# Patient Record
Sex: Female | Born: 1960 | Hispanic: No | Marital: Married | Smoking: Never smoker
Health system: Southern US, Community
[De-identification: ages and names within clinical notes are randomized; demographics above are authoritative.]

## PROBLEM LIST (undated history)

## (undated) HISTORY — PX: FOOT SURGERY: SHX648

---

## 1998-04-29 ENCOUNTER — Other Ambulatory Visit: Admission: RE | Admit: 1998-04-29 | Discharge: 1998-04-29 | Payer: Self-pay | Admitting: *Deleted

## 1999-05-15 ENCOUNTER — Other Ambulatory Visit: Admission: RE | Admit: 1999-05-15 | Discharge: 1999-05-15 | Payer: Self-pay | Admitting: *Deleted

## 2000-05-20 ENCOUNTER — Other Ambulatory Visit: Admission: RE | Admit: 2000-05-20 | Discharge: 2000-05-20 | Payer: Self-pay | Admitting: *Deleted

## 2001-05-21 ENCOUNTER — Other Ambulatory Visit: Admission: RE | Admit: 2001-05-21 | Discharge: 2001-05-21 | Payer: Self-pay | Admitting: *Deleted

## 2001-10-20 ENCOUNTER — Other Ambulatory Visit: Admission: RE | Admit: 2001-10-20 | Discharge: 2001-10-20 | Payer: Self-pay | Admitting: *Deleted

## 2002-01-22 HISTORY — PX: BREAST EXCISIONAL BIOPSY: SUR124

## 2002-01-27 ENCOUNTER — Encounter (INDEPENDENT_AMBULATORY_CARE_PROVIDER_SITE_OTHER): Payer: Self-pay | Admitting: *Deleted

## 2002-01-27 ENCOUNTER — Ambulatory Visit (HOSPITAL_BASED_OUTPATIENT_CLINIC_OR_DEPARTMENT_OTHER): Admission: RE | Admit: 2002-01-27 | Discharge: 2002-01-27 | Payer: Self-pay | Admitting: Surgery

## 2002-05-21 ENCOUNTER — Other Ambulatory Visit: Admission: RE | Admit: 2002-05-21 | Discharge: 2002-05-21 | Payer: Self-pay | Admitting: *Deleted

## 2003-04-12 ENCOUNTER — Other Ambulatory Visit: Admission: RE | Admit: 2003-04-12 | Discharge: 2003-04-12 | Payer: Self-pay | Admitting: *Deleted

## 2004-04-11 ENCOUNTER — Ambulatory Visit: Payer: Self-pay | Admitting: Internal Medicine

## 2004-04-26 ENCOUNTER — Other Ambulatory Visit: Admission: RE | Admit: 2004-04-26 | Discharge: 2004-04-26 | Payer: Self-pay | Admitting: *Deleted

## 2004-09-14 ENCOUNTER — Ambulatory Visit: Payer: Self-pay | Admitting: Internal Medicine

## 2004-09-19 ENCOUNTER — Ambulatory Visit: Payer: Self-pay | Admitting: Internal Medicine

## 2005-01-26 ENCOUNTER — Ambulatory Visit: Payer: Self-pay | Admitting: Internal Medicine

## 2005-05-14 ENCOUNTER — Other Ambulatory Visit: Admission: RE | Admit: 2005-05-14 | Discharge: 2005-05-14 | Payer: Self-pay | Admitting: *Deleted

## 2005-07-04 ENCOUNTER — Other Ambulatory Visit: Admission: RE | Admit: 2005-07-04 | Discharge: 2005-07-04 | Payer: Self-pay | Admitting: *Deleted

## 2005-09-20 ENCOUNTER — Ambulatory Visit: Payer: Self-pay | Admitting: Internal Medicine

## 2005-11-28 ENCOUNTER — Ambulatory Visit: Payer: Self-pay | Admitting: Internal Medicine

## 2006-01-09 ENCOUNTER — Other Ambulatory Visit: Admission: RE | Admit: 2006-01-09 | Discharge: 2006-01-09 | Payer: Self-pay | Admitting: *Deleted

## 2006-05-13 ENCOUNTER — Other Ambulatory Visit: Admission: RE | Admit: 2006-05-13 | Discharge: 2006-05-13 | Payer: Self-pay | Admitting: *Deleted

## 2006-05-15 ENCOUNTER — Ambulatory Visit: Payer: Self-pay | Admitting: Internal Medicine

## 2006-06-21 ENCOUNTER — Encounter: Admission: RE | Admit: 2006-06-21 | Discharge: 2006-06-21 | Payer: Self-pay | Admitting: General Surgery

## 2006-10-09 ENCOUNTER — Ambulatory Visit: Payer: Self-pay | Admitting: Internal Medicine

## 2006-10-10 ENCOUNTER — Ambulatory Visit: Payer: Self-pay | Admitting: Internal Medicine

## 2006-12-17 ENCOUNTER — Telehealth (INDEPENDENT_AMBULATORY_CARE_PROVIDER_SITE_OTHER): Payer: Self-pay | Admitting: *Deleted

## 2006-12-23 ENCOUNTER — Encounter: Admission: RE | Admit: 2006-12-23 | Discharge: 2006-12-23 | Payer: Self-pay | Admitting: General Surgery

## 2007-04-09 ENCOUNTER — Ambulatory Visit: Payer: Self-pay | Admitting: Internal Medicine

## 2007-05-13 ENCOUNTER — Other Ambulatory Visit: Admission: RE | Admit: 2007-05-13 | Discharge: 2007-05-13 | Payer: Self-pay | Admitting: Gynecology

## 2007-05-19 ENCOUNTER — Encounter: Admission: RE | Admit: 2007-05-19 | Discharge: 2007-05-19 | Payer: Self-pay | Admitting: *Deleted

## 2007-10-06 DIAGNOSIS — J301 Allergic rhinitis due to pollen: Secondary | ICD-10-CM | POA: Insufficient documentation

## 2007-10-07 ENCOUNTER — Ambulatory Visit: Payer: Self-pay | Admitting: Internal Medicine

## 2007-10-16 ENCOUNTER — Ambulatory Visit: Payer: Self-pay | Admitting: Internal Medicine

## 2008-03-16 ENCOUNTER — Ambulatory Visit: Payer: Self-pay | Admitting: Internal Medicine

## 2008-04-06 ENCOUNTER — Encounter: Admission: RE | Admit: 2008-04-06 | Discharge: 2008-04-06 | Payer: Self-pay | Admitting: Gynecology

## 2008-08-10 ENCOUNTER — Ambulatory Visit: Payer: Self-pay | Admitting: Internal Medicine

## 2008-10-08 ENCOUNTER — Ambulatory Visit: Payer: Self-pay | Admitting: Internal Medicine

## 2009-01-19 ENCOUNTER — Ambulatory Visit: Payer: Self-pay | Admitting: Internal Medicine

## 2009-03-11 ENCOUNTER — Emergency Department (HOSPITAL_COMMUNITY): Admission: EM | Admit: 2009-03-11 | Discharge: 2009-03-11 | Payer: Self-pay | Admitting: Emergency Medicine

## 2009-04-11 ENCOUNTER — Encounter: Admission: RE | Admit: 2009-04-11 | Discharge: 2009-04-11 | Payer: Self-pay | Admitting: Gynecology

## 2009-07-07 ENCOUNTER — Ambulatory Visit: Payer: Self-pay | Admitting: Internal Medicine

## 2009-10-07 ENCOUNTER — Ambulatory Visit: Payer: Self-pay | Admitting: Internal Medicine

## 2009-12-14 ENCOUNTER — Ambulatory Visit: Payer: Self-pay | Admitting: Internal Medicine

## 2010-02-12 ENCOUNTER — Encounter: Payer: Self-pay | Admitting: General Surgery

## 2010-02-21 NOTE — Assessment & Plan Note (Signed)
Summary: 1 YEAR//MBW   Primary Provider/Referring Provider:  Lajean Manes  CC:  Pt here for yearly follow up. Pt c/o PND this AM. No other complaints to report.  History of Present Illness: 10/10/06- Last visit:  HISTORY OF PRESENT ILLNESS:  She has done very well.  Her physician father continues to give her allergy injections with no problems.  She is convinced they help and that she is much better since she has been on them.  Asked to refill Clarinex; asked to change Nasonex to cheaper fluticasone.  We discussed these meds, and I invited her to try over-the- counter Claritin.  She still has no wheezing, nothing purulent.   10/07/07- returns for follow-up.  Change Claritin to Zyrtec- d for congestion.  Father continues to give her allergy vaccine at 1:10 with no problems.  Risk and epinephrine again, were reviewed.  10/08/08- Allergic rhinitis One year f/u. In last 2 weeks some seasonal rhinitis with some stuffiness and drainage treated with zyrtec-D and fluticasone.  Discussed risk, benefit and epipen. Father is retired physician who gives her shots and we discussed this as well. Never asthma.  October 07, 2009- Allergic rhinitis One year f/u. Says she did really well over the last year. Needed suplemental med only rarely. Today with temprature change she used nose spray. Allergy vaccine doing very well.  Got flu shot yesterday at work.   Preventive Screening-Counseling & Management  Alcohol-Tobacco     Smoking Status: never  Current Medications (verified): 1)  Allergy Vaccine 1:10 Father Gives .... Once Weekly 2)  Epipen 0.3 Mg/0.60ml (1:1000) Devi (Epinephrine Hcl (Anaphylaxis)) .... For Severe Allergic Reaction 3)  Zyrtec-D Allergy & Congestion 5-120 Mg Xr12h-Tab (Cetirizine-Pseudoephedrine) .... Take As Needed 4)  Fluticasone Propionate 50 Mcg/act Susp (Fluticasone Propionate) .Marland Kitchen.. 1-2 Sprays in Each Nostril Daily  Allergies (verified): 1)  ! Penicillin  Past  History:  Past Medical History: Last updated: 10/07/2007  ALLERGIC RHINITIS, SEASONAL (ICD-477.0)  Past Surgical History: Last updated: 10/08/2008 Breast lump right- benign Foot surgery  Family History: Last updated: 10/08/2008 Paternal grandfather diabetes  Social History: Last updated: 10/08/2008 Married with one child Works full time at [employer] Never smoked  Risk Factors: Smoking Status: never (10/07/2009)  Review of Systems      See HPI  The patient denies shortness of breath with activity, shortness of breath at rest, productive cough, non-productive cough, coughing up blood, chest pain, irregular heartbeats, acid heartburn, indigestion, loss of appetite, weight change, abdominal pain, difficulty swallowing, sore throat, tooth/dental problems, headaches, sneezing, itching, ear ache, rash, change in color of mucus, and fever.    Vital Signs:  Patient profile:   50 year old female Height:      62 inches Weight:      108 pounds BMI:     19.82 O2 Sat:      100 % on Room air Pulse rate:   87 / minute BP sitting:   120 / 80  (left arm) Cuff size:   regular  Vitals Entered By: Zackery Barefoot CMA (October 07, 2009 10:27 AM)  O2 Flow:  Room air CC: Pt here for yearly follow up. Pt c/o PND this AM. No other complaints to report Comments Medications reviewed with patient Verified contact number and pharmacy with patient Zackery Barefoot CMA  October 07, 2009 10:34 AM    Physical Exam  Additional Exam:  General: A/Ox3; pleasant and cooperative, NAD, SKIN: no rash, lesions NODES: no lymphadenopathy HEENT: [place]/AT, EOM- WNL, Conjuctivae- clear,  PERRLA, TM-WNL, Nose- clear, Throat- clear and wnl, Mallampati II NECK: Supple w/ fair ROM, JVD- none, normal carotid impulses w/o bruits Thyroid-  CHEST: Clear to P&A HEART: RRR, no m/g/r heard ABDOMEN: nontender ZOX:WRUE, nl pulses, no edema  NEURO: Grossly intact to observation      Impression &  Recommendations:  Problem # 1:  ALLERGIC RHINITIS, SEASONAL (ICD-477.0)  Doing very well with no reason to make changes.  We reviewed allergy vaccine safety and goals again.  Other Orders: Est. Patient Level III (45409)  Patient Instructions: 1)  Please schedule a follow-up appointment in 1 year. 2)  I think you are doing great. Please let me know if you have any questions or problems.  Prescriptions: FLUTICASONE PROPIONATE 50 MCG/ACT SUSP (FLUTICASONE PROPIONATE) 1-2 sprays in each nostril daily  #1 x prn   Entered and Authorized by:   Waymon Budge MD   Signed by:   Waymon Budge MD on 10/07/2009   Method used:   Electronically to        [facility]  [phone]* (retail)       [address] [place], [place]  [address]       Ph: [phone] or [phone]       Fax: [phone]   RxID:   [id number] EPIPEN 0.3 MG/0.3ML (1:1000) DEVI (EPINEPHRINE HCL (ANAPHYLAXIS)) For severe allergic reaction  #1 x prn   Entered and Authorized by:   Waymon Budge MD   Signed by:   Waymon Budge MD on 10/07/2009   Method used:   Electronically to        [facility]  [phone]* (retail)       [address] [place], [place]  [address]       Ph: [phone] or [phone]       Fax: [phone]   RxID:   [id number]     Immunization History:  Influenza Immunization History:    Influenza:  historical (10/06/2009)

## 2010-04-03 ENCOUNTER — Other Ambulatory Visit: Payer: Self-pay | Admitting: Gynecology

## 2010-04-03 DIAGNOSIS — Z1231 Encounter for screening mammogram for malignant neoplasm of breast: Secondary | ICD-10-CM

## 2010-04-18 ENCOUNTER — Ambulatory Visit: Payer: Self-pay

## 2010-05-02 ENCOUNTER — Ambulatory Visit
Admission: RE | Admit: 2010-05-02 | Discharge: 2010-05-02 | Disposition: A | Discharge status: Home / Self Care | Payer: BLUE CROSS/BLUE SHIELD | Source: Ambulatory Visit | Attending: Gynecology | Admitting: Gynecology

## 2010-05-02 DIAGNOSIS — Z1231 Encounter for screening mammogram for malignant neoplasm of breast: Secondary | ICD-10-CM

## 2010-05-09 ENCOUNTER — Ambulatory Visit (INDEPENDENT_AMBULATORY_CARE_PROVIDER_SITE_OTHER): Payer: BLUE CROSS/BLUE SHIELD

## 2010-05-09 DIAGNOSIS — J309 Allergic rhinitis, unspecified: Secondary | ICD-10-CM

## 2010-06-06 NOTE — Assessment & Plan Note (Signed)
[facility]                             PULMONARY OFFICE NOTE   NAME:JOYCEHalayna, Blane                      MRN:          [id number]  DATE:10/10/2006                            DOB:          [birth year]    PROBLEM LIST:  Seasonal allergic rhinitis.   HISTORY OF PRESENT ILLNESS:  She has done very well.  Her physician  father continues to give her allergy injections with no problems.  She  is convinced they help and that she is much better since she has been on  them.  Asked to refill Clarinex; asked to change Nasonex to cheaper  fluticasone.  We discussed these meds, and I invited her to try over-the-  counter Claritin.  She still has no wheezing, nothing purulent.   MEDICATIONS:  1. Allergy vaccine.  2. Clarinex.  3. Nasonex.  4. Drug intolerant penicillin with rash.   PHYSICAL EXAMINATION/OBJECTIVE:  VITAL SIGNS:  Weight 109 pounds, BP  134/80, pulse 93, room air saturation 100%.  HEENT:  Nasal mucosa looks normal, conjunctivae are not injected.  There  is no periorbital edema.  No postnasal drainage or hoarseness.  Breathing unlabored.  Pulse regular without murmur.   IMPRESSION:  Allergic rhinitis under good control.   PLAN:  Medications discussed.  Nasonex changed to fluticasone nasal  spray once or twice each nostril daily.  She will continue her allergy  vaccine and schedule return in one year or earlier p.r.n.     Clinton D. Maple Hudson, MD, Tonny Bollman, FACP  Electronically Signed    CDY/MedQ  DD: 10/10/2006  DT: 10/11/2006  Job #: [id number]   cc:   Oley Balm. Georgina Pillion, M.D.

## 2010-06-09 NOTE — Assessment & Plan Note (Signed)
[facility]                               PULMONARY OFFICE NOTE   NAME:[name], [name]                      MRN:          [id number]  DATE:09/20/2005                            DOB:          1960/02/02    PROBLEM:  Seasonal allergic rhinitis, history.  One year follow-up.   She reports this is a good year.  She never has asthma and in spite of the  air quality, she has been symptomatically pretty comfortable.  She continues  allergy vaccine at 1:10 with injections given by her father, a retired  Development worker, community.  We took time today to go over risk management and options  related to allergy vaccine.  We discussed policy related to administration  away from a medical office, anaphylaxis, epinephrine and choices.  She  reviewed our waver form and wished to continue getting her vaccine at home.  She has had no reactions or adverse problems and feels it helps.   MEDICATIONS:  1. Allergy vaccine.  2. Clarinex.  3. Nasonex.   ALLERGIES:  Drug intolerant to PENICILLIN with rash.   OBJECTIVE:  VITAL SIGNS:  Weight 112 pounds, blood pressure 128/68, pulse  regular 78, room air saturation 100%.  SKIN:  Clear.  No adenopathy found.  HEENT:  Conjunctivae were clear with normal secretions.  Nasal mucosa did  not look inflamed or edematous.  Her pharynx was clear with no post nasal  drip.  LUNGS:  Clear to percussion and auscultation.  CARDIOVASCULAR:  Heart sounds were regular without  murmur.   IMPRESSION:  Stable allergic rhinitis comfortably controlled.   PLAN:  1. Continue vaccine.  2. Refill Nasonex and Clarinex.  3. Refill Epipen with discussion.  4. Schedule return one year, earlier p.r.n.                                  Clinton D. Maple Hudson, MD, FCCP, FACP   CDY/MedQ  DD:  09/20/2005  DT:  09/21/2005  Job #:  [id number]   cc:   Oley Balm. Georgina Pillion, MD

## 2010-06-09 NOTE — Op Note (Signed)
[name], [name]                         ACCOUNT NO.:  [id number]   MEDICAL RECORD NO.:  [id number]                   PATIENT TYPE:  AMB   LOCATION:  [facility]                                  FACILITY:  [facility]   PHYSICIAN:  Abigail Miyamoto, M.D.              DATE OF BIRTH:  June 30, 1960   DATE OF PROCEDURE:  01/27/2002  DATE OF DISCHARGE:                                 OPERATIVE REPORT   PREOPERATIVE DIAGNOSES:  Right breast mass.   POSTOPERATIVE DIAGNOSES:  Right breast mass.   PROCEDURE:  Excision of right breast mass.   SURGEON:  Abigail Miyamoto, M.D.   ANESTHESIA:  1% lidocaine and monitored anesthesia care.   ESTIMATED BLOOD LOSS:  Minimal.   INDICATIONS FOR PROCEDURE:  [name] is a 50 year old female who felt  a palpable fullness in the right upper quadrant of the right breast.  Mammogram was unremarkable except for fibrocystic changes and indeed  ultrasound was similar except for some mild change in echogenicity in this  area. On examination, she did have approximately a 1.5 cm to 2 cm palpable  area in the tail of Spence. We had a discussion regarding this and she  elected to have this area excised instead of following it conservatively  with follow-up films.   DESCRIPTION OF PROCEDURE:  The patient was brought to the operating room,  identified as [name]. She was placed supine on the operating room  table and then anesthesia was induced. The right breast was then prepped and  draped in the usual sterile fashion. The skin overlying the palpable mass in  the right upper outer quadrant was then anesthetized with 1% lidocaine. A  small transverse incision was made over the palpable abnormality with the  scalpel. Electrocautery was then used to fully dissect out the palpable  mass. The mass appeared to be consistent with fibrocystic breast tissue.  Allis clamps were used to elevate the mass and the mass was taken down in  its entirety with the  electrocautery circumferentially. The mass was sent to  pathology for identification. The cavity was examined and hemostasis was  achieved with the cautery. The cavity was then irrigated with normal saline.  The subcutaneous layer was then closed with interrupted 3-0 Vicryl sutures,  the skin was closed with running 4-0 Monocryl. Steri-Strips, gauze and tape  were then applied. The patient tolerated the procedure well. All sponge,  needle and instrument counts were correct at the end of the procedure. The  patient was then taken in a stable condition from the operating room to the  recovery room.                                               Abigail Miyamoto, M.D.    DB/MEDQ  D:  01/27/2002  T:  01/27/2002  Job:  [id number]

## 2010-06-11 ENCOUNTER — Other Ambulatory Visit: Payer: Self-pay | Admitting: Internal Medicine

## 2010-06-15 ENCOUNTER — Telehealth: Payer: Self-pay | Admitting: Internal Medicine

## 2010-06-15 MED ORDER — FLUTICASONE PROPIONATE 50 MCG/ACT NA SUSP: NASAL | Status: DC

## 2010-06-15 NOTE — Telephone Encounter (Signed)
Rx was refilled.  Spoke with pt and notified this was done.

## 2010-10-06 ENCOUNTER — Ambulatory Visit: Payer: Self-pay | Admitting: Internal Medicine

## 2010-10-25 ENCOUNTER — Ambulatory Visit: Payer: Self-pay | Admitting: Internal Medicine

## 2010-11-01 ENCOUNTER — Ambulatory Visit (INDEPENDENT_AMBULATORY_CARE_PROVIDER_SITE_OTHER): Payer: BLUE CROSS/BLUE SHIELD

## 2010-11-01 DIAGNOSIS — J309 Allergic rhinitis, unspecified: Secondary | ICD-10-CM

## 2010-11-27 ENCOUNTER — Encounter: Payer: Self-pay | Admitting: Internal Medicine

## 2010-11-28 ENCOUNTER — Encounter: Payer: Self-pay | Admitting: Internal Medicine

## 2010-11-28 ENCOUNTER — Ambulatory Visit (INDEPENDENT_AMBULATORY_CARE_PROVIDER_SITE_OTHER): Payer: BLUE CROSS/BLUE SHIELD | Admitting: Internal Medicine

## 2010-11-28 VITALS — BP 126/82 | HR 76 | Ht 62.0 in | Wt 111.0 lb

## 2010-11-28 DIAGNOSIS — J301 Allergic rhinitis due to pollen: Secondary | ICD-10-CM

## 2010-11-28 MED ORDER — EPINEPHRINE 0.3 MG/0.3ML IJ DEVI: 0.3000 mg | Freq: Once | INTRAMUSCULAR | Status: DC

## 2010-11-28 MED ORDER — FLUTICASONE PROPIONATE 50 MCG/ACT NA SUSP: NASAL | Status: DC

## 2010-11-28 NOTE — Patient Instructions (Signed)
Continue present treatment- please call as needed  Scripts sent for Epipen and for fluticasone nasal spray
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
## 2010-11-30 NOTE — Assessment & Plan Note (Signed)
She is satisfied to continue her allergy vaccine and doesn't want changes made. We discussed risks benefits and goals.

## 2011-03-29 ENCOUNTER — Other Ambulatory Visit: Payer: Self-pay | Admitting: Gynecology

## 2011-03-29 DIAGNOSIS — Z1231 Encounter for screening mammogram for malignant neoplasm of breast: Secondary | ICD-10-CM

## 2011-05-03 ENCOUNTER — Ambulatory Visit: Payer: BLUE CROSS/BLUE SHIELD

## 2011-05-08 ENCOUNTER — Ambulatory Visit: Payer: BLUE CROSS/BLUE SHIELD

## 2011-05-16 ENCOUNTER — Ambulatory Visit (INDEPENDENT_AMBULATORY_CARE_PROVIDER_SITE_OTHER): Payer: BLUE CROSS/BLUE SHIELD

## 2011-05-16 DIAGNOSIS — J309 Allergic rhinitis, unspecified: Secondary | ICD-10-CM

## 2011-05-21 ENCOUNTER — Ambulatory Visit: Payer: BLUE CROSS/BLUE SHIELD

## 2011-05-22 ENCOUNTER — Ambulatory Visit
Admission: RE | Admit: 2011-05-22 | Discharge: 2011-05-22 | Disposition: A | Discharge status: Home / Self Care | Payer: BC Managed Care – PPO | Source: Ambulatory Visit | Attending: Gynecology | Admitting: Gynecology

## 2011-05-22 DIAGNOSIS — Z1231 Encounter for screening mammogram for malignant neoplasm of breast: Secondary | ICD-10-CM

## 2011-11-28 ENCOUNTER — Ambulatory Visit: Payer: BLUE CROSS/BLUE SHIELD | Admitting: Internal Medicine

## 2011-11-29 ENCOUNTER — Encounter: Payer: Self-pay | Admitting: Internal Medicine

## 2011-11-29 ENCOUNTER — Ambulatory Visit (INDEPENDENT_AMBULATORY_CARE_PROVIDER_SITE_OTHER): Payer: BC Managed Care – PPO | Admitting: Internal Medicine

## 2011-11-29 VITALS — BP 118/76 | HR 87 | Ht 62.0 in | Wt 111.8 lb

## 2011-11-29 DIAGNOSIS — J301 Allergic rhinitis due to pollen: Secondary | ICD-10-CM

## 2011-11-29 MED ORDER — FLUTICASONE PROPIONATE 50 MCG/ACT NA SUSP: NASAL | Status: DC

## 2011-11-29 MED ORDER — EPINEPHRINE 0.3 MG/0.3ML IJ DEVI: 0.3000 mg | Freq: Once | INTRAMUSCULAR | Status: DC

## 2011-11-29 NOTE — Patient Instructions (Addendum)
Refill scripts sent for Epipen and for flonase/ fluticasone  We can continue your allergy vaccine  Please call as needed
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
## 2011-12-04 NOTE — Assessment & Plan Note (Signed)
Doing well with allergy vaccine and intervals using Flonase. She feels these are sufficient.  Plan- flonase and Epipen refilled with discussion.

## 2011-12-05 ENCOUNTER — Ambulatory Visit (INDEPENDENT_AMBULATORY_CARE_PROVIDER_SITE_OTHER): Payer: BC Managed Care – PPO

## 2011-12-05 DIAGNOSIS — J309 Allergic rhinitis, unspecified: Secondary | ICD-10-CM

## 2011-12-06 ENCOUNTER — Ambulatory Visit (INDEPENDENT_AMBULATORY_CARE_PROVIDER_SITE_OTHER): Payer: BC Managed Care – PPO

## 2011-12-06 DIAGNOSIS — J309 Allergic rhinitis, unspecified: Secondary | ICD-10-CM

## 2012-04-22 ENCOUNTER — Other Ambulatory Visit: Payer: Self-pay

## 2012-04-22 DIAGNOSIS — Z1231 Encounter for screening mammogram for malignant neoplasm of breast: Secondary | ICD-10-CM

## 2012-05-21 ENCOUNTER — Ambulatory Visit (INDEPENDENT_AMBULATORY_CARE_PROVIDER_SITE_OTHER): Payer: BC Managed Care – PPO

## 2012-05-21 DIAGNOSIS — J309 Allergic rhinitis, unspecified: Secondary | ICD-10-CM

## 2012-05-22 ENCOUNTER — Ambulatory Visit: Payer: BC Managed Care – PPO

## 2012-06-19 ENCOUNTER — Ambulatory Visit
Admission: RE | Admit: 2012-06-19 | Discharge: 2012-06-19 | Disposition: A | Discharge status: Home / Self Care | Payer: BC Managed Care – PPO | Source: Ambulatory Visit

## 2012-06-19 DIAGNOSIS — Z1231 Encounter for screening mammogram for malignant neoplasm of breast: Secondary | ICD-10-CM

## 2012-08-27 ENCOUNTER — Ambulatory Visit (INDEPENDENT_AMBULATORY_CARE_PROVIDER_SITE_OTHER): Payer: BC Managed Care – PPO

## 2012-08-27 DIAGNOSIS — J309 Allergic rhinitis, unspecified: Secondary | ICD-10-CM

## 2012-11-28 ENCOUNTER — Ambulatory Visit (INDEPENDENT_AMBULATORY_CARE_PROVIDER_SITE_OTHER): Payer: BC Managed Care – PPO | Admitting: Internal Medicine

## 2012-11-28 ENCOUNTER — Encounter: Payer: Self-pay | Admitting: Internal Medicine

## 2012-11-28 VITALS — BP 124/66 | HR 89 | Ht 62.0 in | Wt 110.4 lb

## 2012-11-28 DIAGNOSIS — J301 Allergic rhinitis due to pollen: Secondary | ICD-10-CM

## 2012-11-28 MED ORDER — EPINEPHRINE 0.3 MG/0.3ML IJ SOAJ: INTRAMUSCULAR | Status: DC

## 2012-11-28 NOTE — Patient Instructions (Signed)
We can continue allergy vaccine 1:10 GO  Epipen refill sent  Please call as needed
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
## 2012-12-10 ENCOUNTER — Other Ambulatory Visit: Payer: Self-pay | Admitting: Internal Medicine

## 2012-12-13 NOTE — Assessment & Plan Note (Signed)
Okay to continue allergy vaccine. We discussed risk/benefit and will refill EpiPen

## 2013-02-13 ENCOUNTER — Ambulatory Visit (INDEPENDENT_AMBULATORY_CARE_PROVIDER_SITE_OTHER): Payer: BC Managed Care – PPO

## 2013-02-13 DIAGNOSIS — J309 Allergic rhinitis, unspecified: Secondary | ICD-10-CM

## 2013-05-15 ENCOUNTER — Other Ambulatory Visit: Payer: Self-pay

## 2013-05-15 DIAGNOSIS — Z1231 Encounter for screening mammogram for malignant neoplasm of breast: Secondary | ICD-10-CM

## 2013-06-22 ENCOUNTER — Ambulatory Visit
Admission: RE | Admit: 2013-06-22 | Discharge: 2013-06-22 | Disposition: A | Discharge status: Home / Self Care | Payer: BC Managed Care – PPO | Source: Ambulatory Visit

## 2013-06-22 ENCOUNTER — Encounter (INDEPENDENT_AMBULATORY_CARE_PROVIDER_SITE_OTHER): Payer: Self-pay

## 2013-06-22 DIAGNOSIS — Z1231 Encounter for screening mammogram for malignant neoplasm of breast: Secondary | ICD-10-CM

## 2013-06-23 ENCOUNTER — Ambulatory Visit (INDEPENDENT_AMBULATORY_CARE_PROVIDER_SITE_OTHER): Payer: BC Managed Care – PPO

## 2013-06-23 DIAGNOSIS — J309 Allergic rhinitis, unspecified: Secondary | ICD-10-CM

## 2013-10-16 ENCOUNTER — Ambulatory Visit (INDEPENDENT_AMBULATORY_CARE_PROVIDER_SITE_OTHER): Payer: BC Managed Care – PPO

## 2013-10-16 DIAGNOSIS — J309 Allergic rhinitis, unspecified: Secondary | ICD-10-CM

## 2013-11-27 ENCOUNTER — Encounter: Payer: Self-pay | Admitting: Internal Medicine

## 2013-12-04 ENCOUNTER — Ambulatory Visit: Payer: BC Managed Care – PPO | Admitting: Internal Medicine

## 2013-12-14 ENCOUNTER — Ambulatory Visit (INDEPENDENT_AMBULATORY_CARE_PROVIDER_SITE_OTHER): Payer: BC Managed Care – PPO

## 2013-12-14 ENCOUNTER — Ambulatory Visit (INDEPENDENT_AMBULATORY_CARE_PROVIDER_SITE_OTHER): Payer: BC Managed Care – PPO | Admitting: Podiatry

## 2013-12-14 ENCOUNTER — Encounter: Payer: Self-pay | Admitting: Podiatry

## 2013-12-14 VITALS — BP 118/80 | HR 80 | Resp 16

## 2013-12-14 DIAGNOSIS — S99921S Unspecified injury of right foot, sequela: Secondary | ICD-10-CM

## 2013-12-14 DIAGNOSIS — M79674 Pain in right toe(s): Secondary | ICD-10-CM

## 2013-12-14 DIAGNOSIS — S8991XS Unspecified injury of right lower leg, sequela: Secondary | ICD-10-CM

## 2013-12-14 DIAGNOSIS — M2041 Other hammer toe(s) (acquired), right foot: Secondary | ICD-10-CM

## 2013-12-14 NOTE — Progress Notes (Signed)
Patient ID: [name], female   DOB: 28-Oct-1960, 53 y.o.   MRN: [id number]  Subjective: 53 year old phenol percent the office today with complaints of right foot intermittent pain. She states that her right second digit crosses over her big toe and she has pain particularly in shoe gear. She states of this been progressive over the past year. She denies any recent injury or trauma to the area. She previously has had [facility] approximately [facility] No other complaints at this time.  Review of systems: Negative otherwise stated above.  Objective: AAO x3, NAD DP/PT pulses palpable bilaterally, CRT less than 3 seconds Protective sensation intact with Simms Weinstein monofilament, vibratory sensation intact, Achilles tendon reflex intact There is a mild prominence of the medial aspect of the first metatarsal head with a slight lateral deviation of the hallux. There is second digit dislocation mobile MPJ crossing over the hallux which is reducible. There is medial deviation of the lesser digits. There is currently no pain with palpation or vibratory sensation over on this digits or the metatarsals. No pain with MTPJ range of motion. No overlying edema, erythema, increase in warmth. No open lesions or pre-ulcerative lesions. No calf pain, swelling, warmth, erythema.  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  Plan: -X-rays were obtained and reviewed with the patient. -Conservative versus surgical options were discussed including alternatives, risks, competitions. -At this time the patient elects to proceed with conservative treatment. Various toe splints/pads were dispensed the patient to help hold the toe in a rectus position. Discussed with her shaking with Ace while walking she is very active. Monitoring clinical changes. Follow-up as needed. Call the office with any questions, concerns, changes  symptoms.

## 2013-12-28 ENCOUNTER — Ambulatory Visit: Payer: BC Managed Care – PPO | Admitting: Internal Medicine

## 2014-01-21 ENCOUNTER — Encounter: Payer: Self-pay | Admitting: Internal Medicine

## 2014-01-21 ENCOUNTER — Ambulatory Visit (INDEPENDENT_AMBULATORY_CARE_PROVIDER_SITE_OTHER): Payer: BC Managed Care – PPO | Admitting: Internal Medicine

## 2014-01-21 VITALS — BP 118/70 | HR 80 | Ht 62.0 in | Wt 107.0 lb

## 2014-01-21 DIAGNOSIS — J301 Allergic rhinitis due to pollen: Secondary | ICD-10-CM

## 2014-01-21 MED ORDER — FLUTICASONE PROPIONATE 50 MCG/ACT NA SUSP: 1.0000 | Freq: Every day | NASAL | Status: AC

## 2014-01-21 MED ORDER — EPINEPHRINE 0.3 MG/0.3ML IJ SOAJ: INTRAMUSCULAR | Status: DC
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
## 2014-01-22 NOTE — Assessment & Plan Note (Signed)
She does very well with allergy vaccine and has had no problems. She wants to continue. She has less need for Flonase or antihistamines but we discussed them as supplements at options.

## 2014-01-22 NOTE — Patient Instructions (Signed)
We can continue allergy vaccine 1:10 G0 Refill prescriptions for EpiPen to hold for vaccine safety, and for Flonase as discussed

## 2014-02-26 ENCOUNTER — Ambulatory Visit (INDEPENDENT_AMBULATORY_CARE_PROVIDER_SITE_OTHER): Payer: Self-pay

## 2014-02-26 DIAGNOSIS — J309 Allergic rhinitis, unspecified: Secondary | ICD-10-CM

## 2014-04-30 ENCOUNTER — Other Ambulatory Visit: Payer: Self-pay

## 2014-04-30 DIAGNOSIS — Z1231 Encounter for screening mammogram for malignant neoplasm of breast: Secondary | ICD-10-CM

## 2014-06-24 ENCOUNTER — Ambulatory Visit
Admission: RE | Admit: 2014-06-24 | Discharge: 2014-06-24 | Disposition: A | Discharge status: Home / Self Care | Payer: BLUE CROSS/BLUE SHIELD | Source: Ambulatory Visit

## 2014-06-24 DIAGNOSIS — Z1231 Encounter for screening mammogram for malignant neoplasm of breast: Secondary | ICD-10-CM

## 2014-09-15 ENCOUNTER — Encounter: Payer: Self-pay | Admitting: Internal Medicine

## 2014-09-23 ENCOUNTER — Ambulatory Visit (INDEPENDENT_AMBULATORY_CARE_PROVIDER_SITE_OTHER): Payer: BLUE CROSS/BLUE SHIELD

## 2014-09-23 ENCOUNTER — Telehealth: Payer: Self-pay | Admitting: Internal Medicine

## 2014-09-23 DIAGNOSIS — J309 Allergic rhinitis, unspecified: Secondary | ICD-10-CM

## 2014-09-23 NOTE — Telephone Encounter (Signed)
Date Mixed: 09/23/14 Vial: 1 Strength: 1:10 Here/Mail/Pick Up: mail Mixed By: Darlyne Russian

## 2015-01-14 ENCOUNTER — Telehealth: Payer: Self-pay | Admitting: Internal Medicine

## 2015-01-14 NOTE — Telephone Encounter (Signed)
Patient has appointment scheduled for yearly check up on 01/25/15.  Patient would like to cancel the January appointment because she cannot get off work.  She wants to re-schedule in February, but no openings.  Also, patient wants to make sure she can still get her Allergy Serum if she cancels her January appointment.    Katie - please advise.

## 2015-01-14 NOTE — Telephone Encounter (Signed)
03/07/15 at 10:45am can be used for patient to be seen and as long as patient is current with appts and keeps her follow up then she should be fine with getting her allergy serum. Thanks.

## 2015-01-14 NOTE — Telephone Encounter (Signed)
Patient notified of below. Patient scheduled to see Dr. Maple HudsonYoung on 03/07/15 at 10:45am Patient aware of appointment. Nothing further needed.

## 2015-01-25 ENCOUNTER — Ambulatory Visit: Payer: BC Managed Care – PPO | Admitting: Internal Medicine

## 2015-03-07 ENCOUNTER — Ambulatory Visit: Payer: BLUE CROSS/BLUE SHIELD | Admitting: Internal Medicine

## 2015-04-25 ENCOUNTER — Telehealth: Payer: Self-pay | Admitting: Internal Medicine

## 2015-04-25 DIAGNOSIS — J309 Allergic rhinitis, unspecified: Secondary | ICD-10-CM | POA: Diagnosis not present

## 2015-04-25 NOTE — Telephone Encounter (Signed)
Allergy Serum Extract Date Mixed: 04/25/15 Vial: 1 Strength: 1:10 Here/Mail/Pick Up: pick-up Mixed By: tbs Last OV: 01/21/14 Pending OV: 05/23/15

## 2015-05-23 ENCOUNTER — Ambulatory Visit (INDEPENDENT_AMBULATORY_CARE_PROVIDER_SITE_OTHER): Payer: BLUE CROSS/BLUE SHIELD | Admitting: Internal Medicine

## 2015-05-23 ENCOUNTER — Encounter: Payer: Self-pay | Admitting: Internal Medicine

## 2015-05-23 VITALS — BP 114/66 | HR 84 | Ht 62.0 in | Wt 109.8 lb

## 2015-05-23 DIAGNOSIS — J301 Allergic rhinitis due to pollen: Secondary | ICD-10-CM | POA: Diagnosis not present

## 2015-05-23 MED ORDER — EPINEPHRINE 0.3 MG/0.3ML IJ SOAJ: INTRAMUSCULAR | Status: AC

## 2015-05-23 NOTE — Patient Instructions (Signed)
We can continue allergy shots through this year as discussed.  Please call as needed
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
## 2015-05-23 NOTE — Assessment & Plan Note (Signed)
No rhinitis symptoms are flaring so far this spring and we are now and grass pollen season. We discussed allergy vaccine and anticipation that the allergy clinic will close at this office in a year. She can stop at any time or continue allergy vaccine through this year. I would then suggest she be off of allergy shots, waiting to reestablish at another office if needed for treatment more than Flonase and antihistamine

## 2015-05-27 ENCOUNTER — Other Ambulatory Visit: Payer: Self-pay

## 2015-05-27 DIAGNOSIS — Z1231 Encounter for screening mammogram for malignant neoplasm of breast: Secondary | ICD-10-CM

## 2015-06-30 ENCOUNTER — Ambulatory Visit
Admission: RE | Admit: 2015-06-30 | Discharge: 2015-06-30 | Disposition: A | Discharge status: Home / Self Care | Payer: BLUE CROSS/BLUE SHIELD | Source: Ambulatory Visit

## 2015-06-30 ENCOUNTER — Other Ambulatory Visit: Payer: Self-pay | Admitting: Family Medicine

## 2015-06-30 DIAGNOSIS — Z1231 Encounter for screening mammogram for malignant neoplasm of breast: Secondary | ICD-10-CM

## 2015-07-31 ENCOUNTER — Emergency Department (HOSPITAL_COMMUNITY)
Admission: EM | Admit: 2015-07-31 | Discharge: 2015-07-31 | Disposition: A | Discharge status: Home / Self Care | Payer: BLUE CROSS/BLUE SHIELD | Attending: Emergency Medicine | Admitting: Emergency Medicine

## 2015-07-31 ENCOUNTER — Emergency Department (HOSPITAL_COMMUNITY): Payer: BLUE CROSS/BLUE SHIELD

## 2015-07-31 ENCOUNTER — Encounter (HOSPITAL_COMMUNITY): Payer: Self-pay

## 2015-07-31 DIAGNOSIS — R42 Dizziness and giddiness: Secondary | ICD-10-CM | POA: Diagnosis present

## 2015-07-31 LAB — CBC
HCT: 42.8 % (ref 36.0–46.0)
Hemoglobin: 14.2 g/dL (ref 12.0–15.0)
MCH: 30.7 pg (ref 26.0–34.0)
MCHC: 33.2 g/dL (ref 30.0–36.0)
MCV: 92.4 fL (ref 78.0–100.0)
PLATELETS: 268 10*3/uL (ref 150–400)
RBC: 4.63 MIL/uL (ref 3.87–5.11)
RDW: 13 % (ref 11.5–15.5)
WBC: 5.4 10*3/uL (ref 4.0–10.5)

## 2015-07-31 LAB — BASIC METABOLIC PANEL
Anion gap: 9 (ref 5–15)
BUN: 18 mg/dL (ref 6–20)
CALCIUM: 9.9 mg/dL (ref 8.9–10.3)
CO2: 25 mmol/L (ref 22–32)
CREATININE: 0.74 mg/dL (ref 0.44–1.00)
Chloride: 105 mmol/L (ref 101–111)
Glucose, Bld: 155 mg/dL — ABNORMAL HIGH (ref 65–99)
Potassium: 5 mmol/L (ref 3.5–5.1)
SODIUM: 139 mmol/L (ref 135–145)

## 2015-07-31 MED ORDER — MECLIZINE HCL 25 MG PO TABS: 25.0000 mg | ORAL_TABLET | Freq: Three times a day (TID) | ORAL | Status: AC | PRN

## 2015-07-31 NOTE — ED Notes (Signed)
Patient able to ambulate independently  

## 2015-07-31 NOTE — Discharge Instructions (Signed)
Meclizine as prescribed.  Follow-up with your primary Dr. if not improving in the next week, and return to the emergency department if your symptoms significantly worsen or change.   Benign Positional Vertigo Vertigo is the feeling that you or your surroundings are moving when they are not. Benign positional vertigo is the most common form of vertigo. The cause of this condition is not serious (is benign). This condition is triggered by certain movements and positions (is positional). This condition can be dangerous if it occurs while you are doing something that could endanger you or others, such as driving.  CAUSES In many cases, the cause of this condition is not known. It may be caused by a disturbance in an area of the inner ear that helps your brain to sense movement and balance. This disturbance can be caused by a viral infection (labyrinthitis), head injury, or repetitive motion. RISK FACTORS This condition is more likely to develop in:  Women.  People who are 55 years of age or older. SYMPTOMS Symptoms of this condition usually happen when you move your head or your eyes in different directions. Symptoms may start suddenly, and they usually last for less than a minute. Symptoms may include:  Loss of balance and falling.  Feeling like you are spinning or moving.  Feeling like your surroundings are spinning or moving.  Nausea and vomiting.  Blurred vision.  Dizziness.  Involuntary eye movement (nystagmus). Symptoms can be mild and cause only slight annoyance, or they can be severe and interfere with daily life. Episodes of benign positional vertigo may return (recur) over time, and they may be triggered by certain movements. Symptoms may improve over time. DIAGNOSIS This condition is usually diagnosed by medical history and a physical exam of the head, neck, and ears. You may be referred to a health care provider who specializes in ear, nose, and throat (ENT) problems  (otolaryngologist) or a provider who specializes in disorders of the nervous system (neurologist). You may have additional testing, including:  MRI.  A CT scan.  Eye movement tests. Your health care provider may ask you to change positions quickly while he or she watches you for symptoms of benign positional vertigo, such as nystagmus. Eye movement may be tested with an electronystagmogram (ENG), caloric stimulation, the Dix-Hallpike test, or the roll test.  An electroencephalogram (EEG). This records electrical activity in your brain.  Hearing tests. TREATMENT Usually, your health care provider will treat this by moving your head in specific positions to adjust your inner ear back to normal. Surgery may be needed in severe cases, but this is rare. In some cases, benign positional vertigo may resolve on its own in 2-4 weeks. HOME CARE INSTRUCTIONS Safety  Move slowly.Avoid sudden body or head movements.  Avoid driving.  Avoid operating heavy machinery.  Avoid doing any tasks that would be dangerous to you or others if a vertigo episode would occur.  If you have trouble walking or keeping your balance, try using a cane for stability. If you feel dizzy or unstable, sit down right away.  Return to your normal activities as told by your health care provider. Ask your health care provider what activities are safe for you. General Instructions  Take over-the-counter and prescription medicines only as told by your health care provider.  Avoid certain positions or movements as told by your health care provider.  Drink enough fluid to keep your urine clear or pale yellow.  Keep all follow-up visits as told by  your health care provider. This is important. SEEK MEDICAL CARE IF:  You have a fever.  Your condition gets worse or you develop new symptoms.  Your family or friends notice any behavioral changes.  Your nausea or vomiting gets worse.  You have numbness or a "pins and  needles" sensation. SEEK IMMEDIATE MEDICAL CARE IF:  You have difficulty speaking or moving.  You are always dizzy.  You faint.  You develop severe headaches.  You have weakness in your legs or arms.  You have changes in your hearing or vision.  You develop a stiff neck.  You develop sensitivity to light.   This information is not intended to replace advice given to you by your health care provider. Make sure you discuss any questions you have with your health care provider.   Document Released: 10/16/2005 Document Revised: 09/29/2014 Document Reviewed: 05/03/2014 Elsevier Interactive Patient Education [employer]

## 2015-07-31 NOTE — ED Provider Notes (Signed)
CSN: [id number]     Arrival date & time 07/31/15  1226 History   First MD Initiated Contact with Patient 07/31/15 1249     Chief Complaint  Patient presents with  . Dizziness     (Consider location/radiation/quality/duration/timing/severity/associated sxs/prior Treatment) HPI Comments: Patient is a 55 year old female with no significant past medical history. She presents for evaluation of dizziness. This is been occurring intermittently for the past 3 days. She was seen by her doctor prescribed meclizine. This seems to help somewhat when she takes it. She had another episode today of this dizziness that she describes as a spinning sensation. This time she developed numbness of both arms. When her symptoms are present, it is worse with movement of her head and change in position. She denies any weakness. She denies any visual disturbances.  Patient is a 55 y.o. female presenting with dizziness. The history is provided by the patient.  Dizziness Quality:  Vertigo Severity:  Moderate Onset quality:  Sudden Duration:  3 days Timing:  Intermittent Progression:  Worsening Chronicity:  New Context: head movement   Relieved by:  Nothing Worsened by:  Nothing Ineffective treatments:  None tried Associated symptoms: no blood in stool, no chest pain and no palpitations   Associated symptoms comment:  Bilateral arm numbness   Past Medical History  Diagnosis Date  . ALLERGIC RHINITIS    Past Surgical History  Procedure Laterality Date  . Breast lumpectomy      right-benign  . Foot surgery     Family History  Problem Relation Age of Onset  . Diabetes Paternal Grandfather    Social History  Substance Use Topics  . Smoking status: Never Smoker   . Smokeless tobacco: Never Used  . Alcohol Use: No   OB History    No data available     Review of Systems  Cardiovascular: Negative for chest pain and palpitations.  Gastrointestinal: Negative for blood in stool.  Neurological:  Positive for dizziness.  All other systems reviewed and are negative.     Allergies  Penicillins and Sulfa antibiotics  Home Medications   Prior to Admission medications   Medication Sig Start Date End Date Taking? Authorizing Provider  calcium carbonate (OS-CAL) 600 MG TABS Take 600 mg by mouth daily.      Historical Provider, MD  cetirizine-pseudoephedrine (ZYRTEC-D) 5-120 MG per tablet Take 1 tablet by mouth 2 (two) times daily.      Historical Provider, MD  EPINEPHrine 0.3 mg/0.3 mL IJ SOAJ injection Inject as directed for severe allergic reaction 05/23/15   Waymon Budgelinton D Young, MD  fluticasone (FLONASE) 50 MCG/ACT nasal spray Place 1-2 sprays into both nostrils daily. 01/21/14   Waymon Budgelinton D Young, MD  Multiple Vitamin (MULTIVITAMIN) tablet Take 1 tablet by mouth daily.      Historical Provider, MD  NONFORMULARY OR COMPOUNDED ITEM Allergy Vaccine 1:10 Given at Home    Historical Provider, MD  Omega-3 Fatty Acids (FISH OIL) 1200 MG CAPS Take 1 capsule by mouth daily.      Historical Provider, MD   BP 156/97 mmHg  Pulse 108  Temp(Src) 98.1 F (36.7 C) (Oral)  Resp 18  Ht 5\' 2"  (1.575 m)  Wt 110 lb (49.896 kg)  BMI 20.11 kg/m2  SpO2 100% Physical Exam  Constitutional: She is oriented to person, place, and time. She appears well-developed and well-nourished. No distress.  HENT:  Head: Normocephalic and atraumatic.  Mouth/Throat: Oropharynx is clear and moist.  Eyes: EOM are normal. Pupils  are equal, round, and reactive to light.  Neck: Normal range of motion. Neck supple.  Cardiovascular: Normal rate and regular rhythm.  Exam reveals no gallop and no friction rub.   No murmur heard. Pulmonary/Chest: Effort normal and breath sounds normal. No respiratory distress. She has no wheezes.  Abdominal: Soft. Bowel sounds are normal. She exhibits no distension. There is no tenderness.  Musculoskeletal: Normal range of motion.  Neurological: She is alert and oriented to person, place, and  time. No cranial nerve deficit. She exhibits normal muscle tone. Coordination normal.  Skin: Skin is warm and dry. She is not diaphoretic.  Nursing note and vitals reviewed.   ED Course  Procedures (including critical care time) Labs Review Labs Reviewed  CBC  BASIC METABOLIC PANEL  URINALYSIS, ROUTINE W REFLEX MICROSCOPIC (NOT AT [facility])    Imaging Review No results found. I have personally reviewed and evaluated these images and lab results as part of my medical decision-making.   EKG Interpretation None      MDM   Final diagnoses:  None    Patient is a 55 year old female who presents with complaints of dizziness. This is described as a spinning sensation and is intermittent and worsened with change in position and movement. Her presentation is most consistent with a peripheral vertigo, however the numbness she described in her arms was inconsistent with this. For this reason, I obtained an MRI of the brain which revealed no acute process. There was the possibility of a subtle abnormality in the cerebellum which was discussed with Dr. Roxy Manns. We are in agreement that this is likely not the cause of her symptoms and not likely to cause her any problems in the future. She will be discharged with continued meclizine and when necessary return.    Geoffery Lyons, MD 07/31/15 [phone]

## 2015-07-31 NOTE — ED Notes (Signed)
Patient here with ongoing intermittent dizziness since Thursday. Has seen her doctor and been seen at urgent care and they prescribed her meclizine. Took the meclizine with some resolving of the dizziness. Now thinks she has numbness to forearms bilateral. No neuro deficits on arrival. Alert and oriented

## 2016-05-29 ENCOUNTER — Other Ambulatory Visit: Payer: Self-pay | Admitting: Obstetrics and Gynecology

## 2016-05-29 DIAGNOSIS — Z1231 Encounter for screening mammogram for malignant neoplasm of breast: Secondary | ICD-10-CM

## 2016-07-02 ENCOUNTER — Ambulatory Visit
Admission: RE | Admit: 2016-07-02 | Discharge: 2016-07-02 | Disposition: A | Discharge status: Home / Self Care | Payer: BLUE CROSS/BLUE SHIELD | Source: Ambulatory Visit | Attending: Obstetrics and Gynecology | Admitting: Obstetrics and Gynecology

## 2016-07-02 DIAGNOSIS — Z1231 Encounter for screening mammogram for malignant neoplasm of breast: Secondary | ICD-10-CM

## 2016-12-10 IMAGING — MR MR HEAD W/O CM
9 of 11 series · 32 of 48 positions shown · non-contrast
Comparison: None.

CLINICAL DATA: 55-year-old female with vertigo, intermittent
dizziness for 4 days. Bilateral upper extremity numbness. Initial
encounter.

EXAM:
MRI HEAD WITHOUT CONTRAST
TECHNIQUE: Multiplanar, multiecho pulse sequences of the brain and surrounding
structures were obtained without intravenous contrast.

[Series 3: DWI · axial · 3.0mm · 0.94mm/px · z∈[-113,+32]mm · 7 of 100 slices shown (1 of 2)]
[im 1/100]
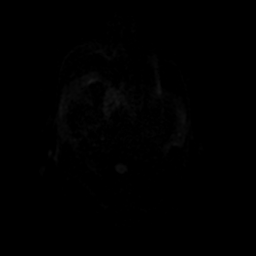
[im 17/100]
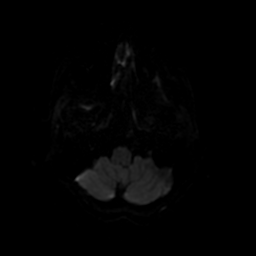
[im 34/100]
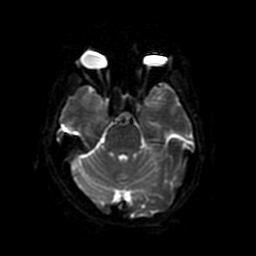
[im 50/100]
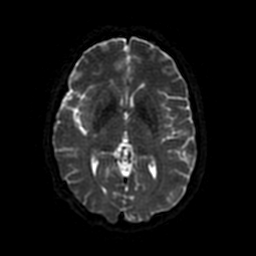
[im 67/100]
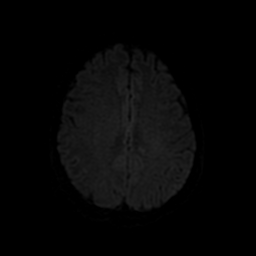
[im 83/100]
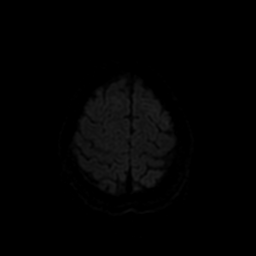
[im 100/100]
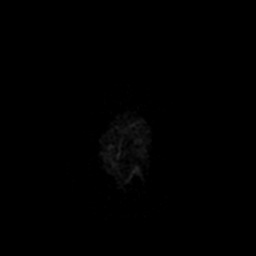

[Series 4: FLAIR · sagittal · 5.0mm · 0.47mm/px · 2 of 25 slices shown (1 of 2)]
[im 1/25]
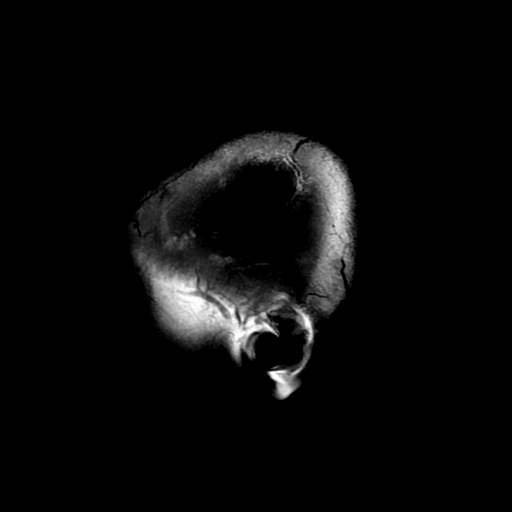
[im 25/25]
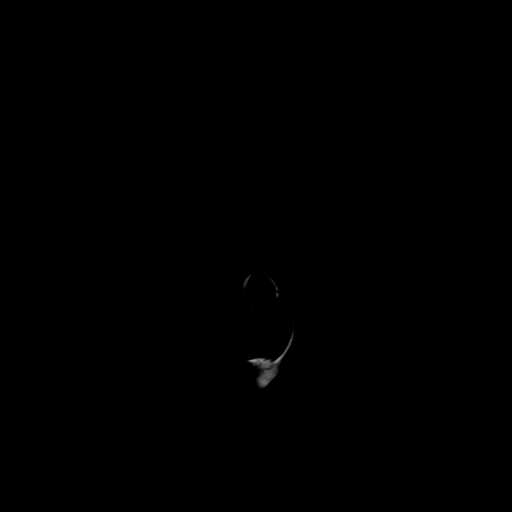

[Series 5: T2 · axial · 5.0mm · 0.47mm/px · z∈[-111,+31]mm · 2 of 25 slices shown]
[im 1/25]
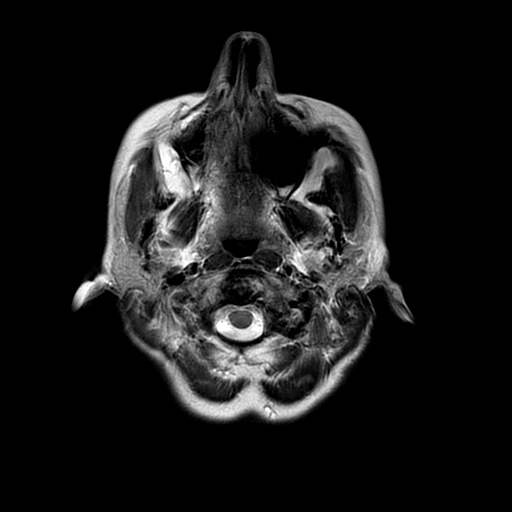
[im 25/25]
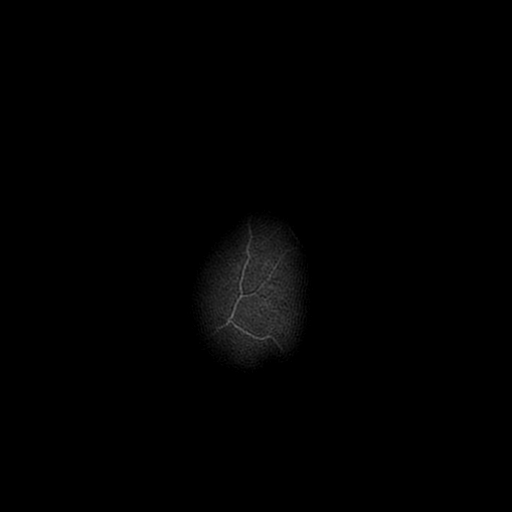

[Series 6: FLAIR · axial · 5.0mm · 0.47mm/px · z∈[-111,+31]mm · 2 of 25 slices shown (2 of 2)]
[im 1/25]
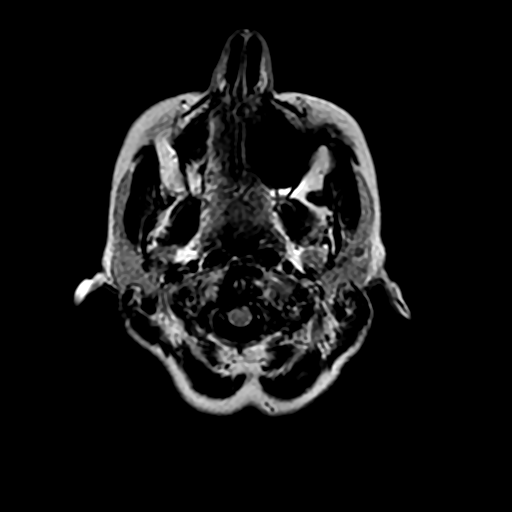
[im 25/25]
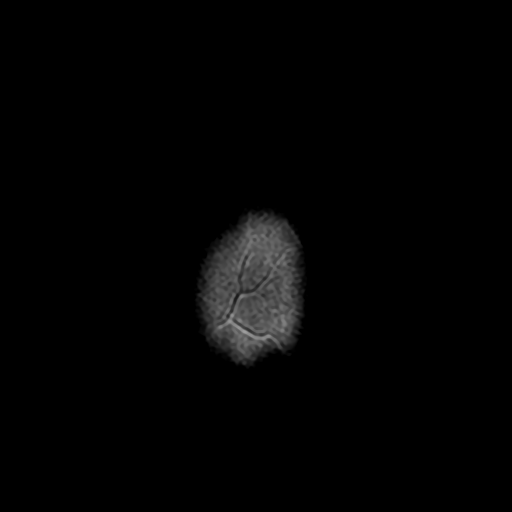

[Series 8: (person_name) · axial · 3.0mm · 0.47mm/px · z∈[-113,-51]mm · 4 of 100 slices shown]
[im 1/100]
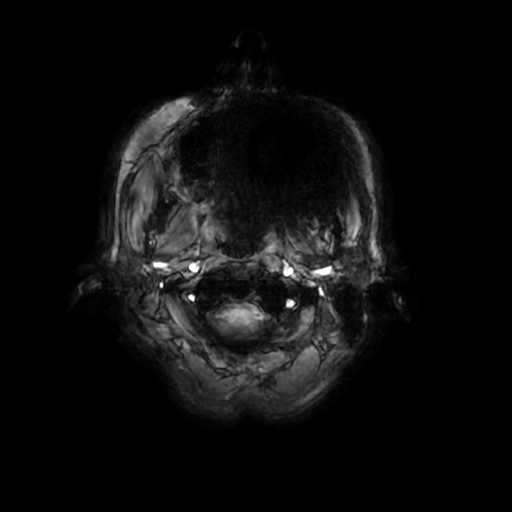
[im 15/100]
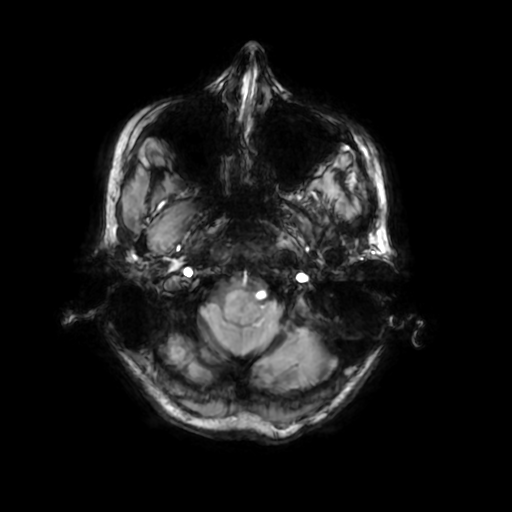
[im 29/100]
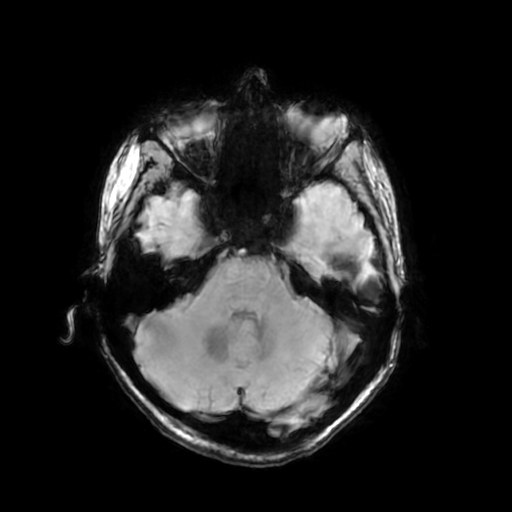
[im 43/100]
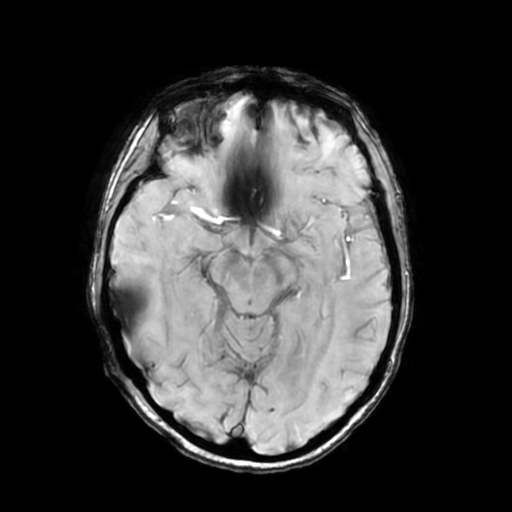

[Series 10: DWI · coronal · 4.0mm · 0.94mm/px · 6 of 74 slices shown (2 of 2)]
[im 1/74]
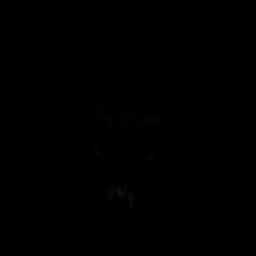
[im 15/74]
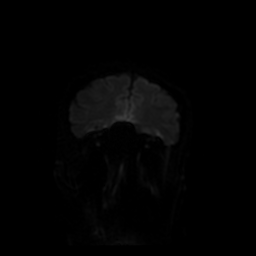
[im 30/74]
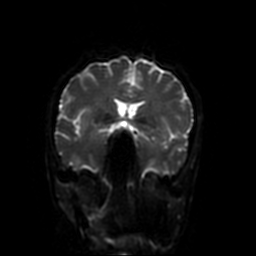
[im 44/74]
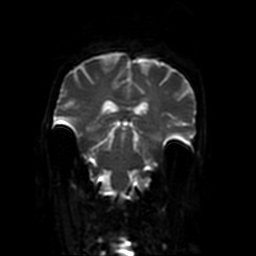
[im 59/74]
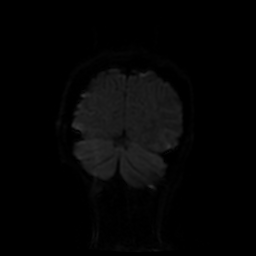
[im 74/74]
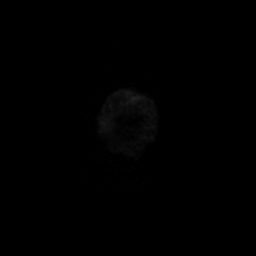

[Series 11: T2 post-contrast · coronal · 5.0mm · 0.39mm/px · 2 of 31 slices shown]
[im 1/31]
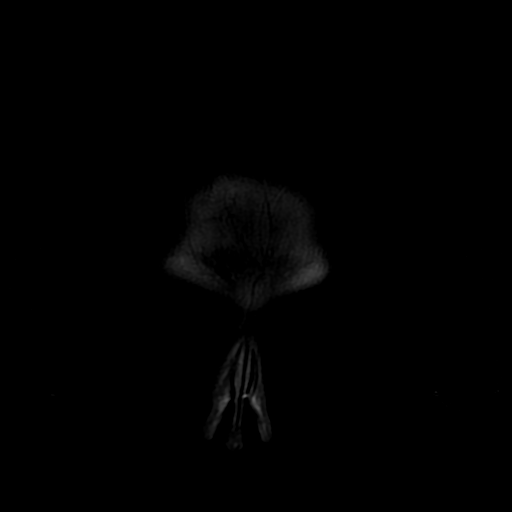
[im 31/31]
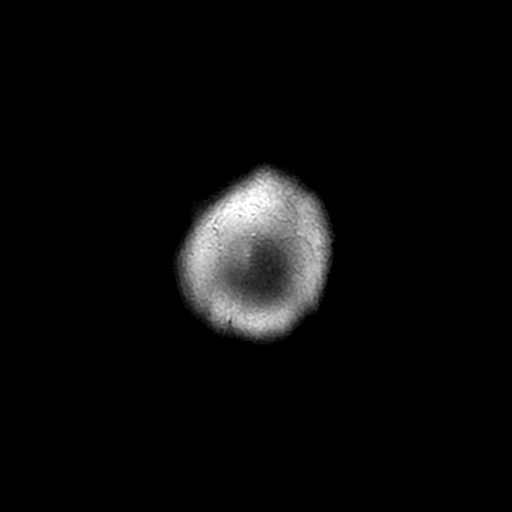

[Series 350: ADC · axial · 3.0mm · 0.94mm/px · z∈[-113,+32]mm · 4 of 47 slices shown (1 of 2)]
[im 1/47]
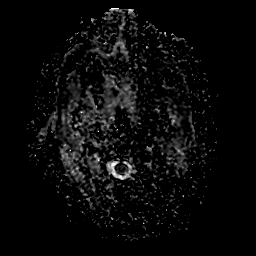
[im 16/47]
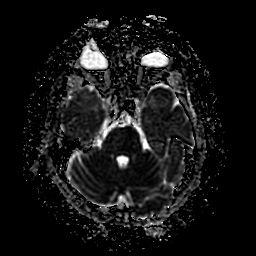
[im 31/47]
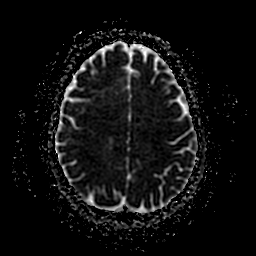
[im 47/47]
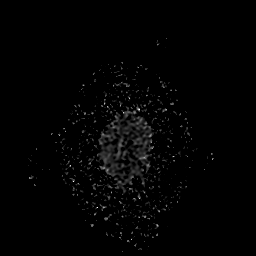

[Series 1050: ADC · coronal · 4.0mm · 0.94mm/px · 3 of 37 slices shown (2 of 2)]
[im 1/37]
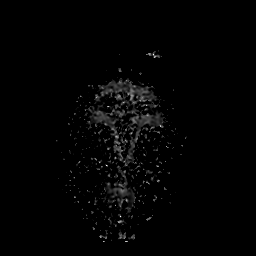
[im 19/37]
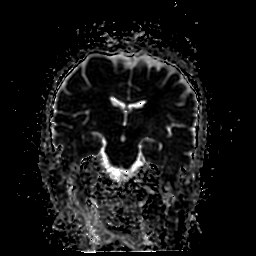
[im 37/37]
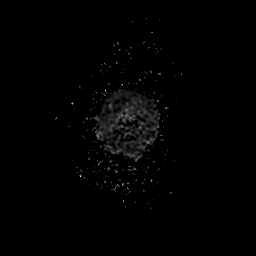

[32 of 48 positions shown; findings below may reference images not displayed]

FINDINGS: Cerebral volume is normal. No restricted diffusion to suggest acute
infarction. No midline shift, mass effect, evidence of mass lesion,
ventriculomegaly, extra-axial collection or acute intracranial
hemorrhage. Cervicomedullary junction and pituitary are within
normal limits. Major intracranial vascular flow voids appear normal.

There are several scattered small cerebral white matter foci of
nonspecific T2 and FLAIR hyperintensity. About 5 such areas are
identified in most ir in the left hemisphere. This extent is
generally considered within normal limits for age. No cortical
encephalomalacia identified. There is abnormal susceptibility signal
in the left cerebellum (series 8, images 22-25), but this might
simply be related to a developmental venous anomaly. Still, small
area of chronic hemorrhage/hemosiderin left cerebellar hemisphere
(as seen on series 8, image 25) is difficult to exclude. There is no
associated T2 lesion to suggest cavernous malformation. The
cerebellum and brainstem otherwise are normal. Deep gray matter
nuclei are normal.

Visible internal auditory structures appear normal. Mastoids are
clear.

Paranasal sinuses are clear. Negative orbit and scalp soft tissues.
Negative visualized cervical spine. Normal bone marrow signal.
IMPRESSION: 1.  No acute intracranial abnormality.
2. Possible small chronic microhemorrhage in the left cerebellum,
although this might simply be related to a developmental venous
anomaly (a normal variant).
3. Otherwise the noncontrast MRI appearance the brain is within
normal limits for age.

## 2017-05-29 ENCOUNTER — Other Ambulatory Visit: Payer: Self-pay | Admitting: Obstetrics and Gynecology

## 2017-05-29 DIAGNOSIS — Z1231 Encounter for screening mammogram for malignant neoplasm of breast: Secondary | ICD-10-CM

## 2017-07-08 ENCOUNTER — Ambulatory Visit: Payer: BLUE CROSS/BLUE SHIELD

## 2017-07-23 ENCOUNTER — Ambulatory Visit
Admission: RE | Admit: 2017-07-23 | Discharge: 2017-07-23 | Disposition: A | Discharge status: Home / Self Care | Payer: BLUE CROSS/BLUE SHIELD | Source: Ambulatory Visit | Attending: Obstetrics and Gynecology | Admitting: Obstetrics and Gynecology

## 2017-07-23 DIAGNOSIS — Z1231 Encounter for screening mammogram for malignant neoplasm of breast: Secondary | ICD-10-CM

## 2018-06-19 ENCOUNTER — Other Ambulatory Visit: Payer: Self-pay | Admitting: Obstetrics and Gynecology

## 2018-06-19 DIAGNOSIS — Z1231 Encounter for screening mammogram for malignant neoplasm of breast: Secondary | ICD-10-CM

## 2018-09-15 ENCOUNTER — Other Ambulatory Visit: Payer: Self-pay

## 2018-09-15 ENCOUNTER — Ambulatory Visit
Admission: RE | Admit: 2018-09-15 | Discharge: 2018-09-15 | Disposition: A | Discharge status: Home / Self Care | Payer: BC Managed Care – PPO | Source: Ambulatory Visit | Attending: Obstetrics and Gynecology | Admitting: Obstetrics and Gynecology

## 2018-09-15 DIAGNOSIS — Z1231 Encounter for screening mammogram for malignant neoplasm of breast: Secondary | ICD-10-CM

## 2019-08-25 ENCOUNTER — Other Ambulatory Visit: Payer: Self-pay | Admitting: Obstetrics and Gynecology

## 2019-08-25 DIAGNOSIS — Z1231 Encounter for screening mammogram for malignant neoplasm of breast: Secondary | ICD-10-CM

## 2019-09-16 ENCOUNTER — Other Ambulatory Visit: Payer: Self-pay

## 2019-09-16 ENCOUNTER — Ambulatory Visit
Admission: RE | Admit: 2019-09-16 | Discharge: 2019-09-16 | Disposition: A | Discharge status: Home / Self Care | Payer: BC Managed Care – PPO | Source: Ambulatory Visit | Attending: Obstetrics and Gynecology | Admitting: Obstetrics and Gynecology

## 2019-09-16 DIAGNOSIS — Z1231 Encounter for screening mammogram for malignant neoplasm of breast: Secondary | ICD-10-CM

## 2020-08-22 ENCOUNTER — Other Ambulatory Visit: Payer: Self-pay | Admitting: Obstetrics and Gynecology

## 2020-08-22 DIAGNOSIS — Z1231 Encounter for screening mammogram for malignant neoplasm of breast: Secondary | ICD-10-CM

## 2020-10-12 ENCOUNTER — Other Ambulatory Visit: Payer: Self-pay

## 2020-10-12 ENCOUNTER — Ambulatory Visit
Admission: RE | Admit: 2020-10-12 | Discharge: 2020-10-12 | Disposition: A | Discharge status: Home / Self Care | Payer: BC Managed Care – PPO | Source: Ambulatory Visit | Attending: Obstetrics and Gynecology | Admitting: Obstetrics and Gynecology

## 2020-10-12 DIAGNOSIS — Z1231 Encounter for screening mammogram for malignant neoplasm of breast: Secondary | ICD-10-CM

## 2021-06-05 ENCOUNTER — Ambulatory Visit (HOSPITAL_BASED_OUTPATIENT_CLINIC_OR_DEPARTMENT_OTHER): Payer: BC Managed Care – PPO | Admitting: Cardiology

## 2021-06-27 NOTE — Progress Notes (Signed)
Cardiology Office Note:    Date:  06/29/2021   ID:  [name], DOB May 21, 1960, MRN [id number]  PCP:  Karina HasMorrow, Aaron, MD  Cardiologist:  Karina RedBridgette Louie Flenner, MD  Referring MD: Karina HasMorrow, Aaron, MD   Chief Complaint  Patient presents with   New Patient (Initial Visit)        Bradycardia    History of Present Illness:    [name] Hendershott is a 61 y.o. female with a hx of hyperlipidemia, white coat hypertension, scoliosis, and osteopenia, who is seen as a new consult at the request of Karina HasMorrow, Aaron, MD for the evaluation and management of bradycardia.  Referral notes from Dr. Kateri Singh personally reviewed. At their 04/21/21 appointment, she was concerned for heart rates in the 40's detected by her smart watch and lasting a few minutes usually at rest. Her EKG showed NSR, R 93, no acute changes. She was referred to cardiology for further evaluation.  Cardiovascular risk factors: Prior clinical ASCVD: None. Comorbid conditions: Hyperlipidemia - Started crestor about 1.5 to 2 years ago. She endorses white coat hypertension, this morning her BP was 125/82 at home  Metabolic syndrome/Obesity: Her highest adult weight is her current weight, 113 lbs. Chronic inflammatory conditions: None. Tobacco use history: Never. Family history: History of stroke and heart attack, hyperlipidemia. Both of her parents had strokes, both at 61 yr old; they also had hypertension. Her paternal grandparents both had strokes in their 5780's. Her maternal grandparents both had heart attacks in their 60s-70s, both were overweight.  Prior cardiac testing and/or incidental findings on other testing (ie coronary calcium): None. Exercise level: She walks 3-5 miles depending on the day or weather. She started walking routines in high school. She plans to retire in a few years and increase her exercise at the gym. Current diet: Typically ate healthy meals growing up. Her breakfast is oatmeal, egg, and a fruit (berries). Lunches  and dinners consist of a protein and a salad/vegetable. May have a piece of dark chocolate every day at most.  She noticed her heart rate dropped a couple times a day to the 40's and 50's. She is not able to tell when her heart rate slows, and the bradycardic episodes seem to occur at random times. She denies any loss of consciousness.  She denies any chest pain, shortness of breath, or peripheral edema. No lightheadedness, headaches, orthopnea, or PND.  Past Medical History:  Diagnosis Date   ALLERGIC RHINITIS     Past Surgical History:  Procedure Laterality Date   BREAST EXCISIONAL BIOPSY Right 2004   right 2004 benign   FOOT SURGERY      Current Medications: Current Outpatient Medications on File Prior to Visit  Medication Sig   calcium carbonate (OS-CAL) 600 MG TABS Take 600 mg by mouth daily.     cetirizine-pseudoephedrine (ZYRTEC-D) 5-120 MG per tablet Take 1 tablet by mouth 2 (two) times daily.     doxepin (SINEQUAN) 10 MG capsule Take 10 mg by mouth daily.   EPINEPHrine 0.3 mg/0.3 mL IJ SOAJ injection Inject as directed for severe allergic reaction   fluticasone (FLONASE) 50 MCG/ACT nasal spray Place 1-2 sprays into both nostrils daily.   meclizine (ANTIVERT) 25 MG tablet Take 1 tablet (25 mg total) by mouth 3 (three) times daily as needed for dizziness.   Multiple Vitamin (MULTIVITAMIN) tablet Take 1 tablet by mouth daily.     NONFORMULARY OR COMPOUNDED ITEM Allergy Vaccine 1:10 Given at Home   Omega-3 Fatty Acids (FISH OIL) 1200  MG CAPS Take 1 capsule by mouth daily.     rosuvastatin (CRESTOR) 5 MG tablet Take 1 tablet by mouth daily.   No current facility-administered medications on file prior to visit.     Allergies:   Penicillins and Sulfa antibiotics   Social History   Tobacco Use   Smoking status: Never   Smokeless tobacco: Never  Substance Use Topics   Alcohol use: No    Alcohol/week: 0.0 standard drinks of alcohol   Drug use: No    Family  History: family history includes Diabetes in her paternal grandfather.  ROS:   Please see the history of present illness.  Additional pertinent ROS: Constitutional: Negative for chills, fever, night sweats, unintentional weight loss  HENT: Negative for ear pain and hearing loss.   Eyes: Negative for loss of vision and eye pain.  Respiratory: Negative for cough, sputum, wheezing.   Cardiovascular: See HPI. Gastrointestinal: Negative for abdominal pain, melena, and hematochezia.  Genitourinary: Negative for dysuria and hematuria.  Musculoskeletal: Negative for falls and myalgias.  Skin: Negative for itching and rash.  Neurological: Negative for focal weakness, focal sensory changes and loss of consciousness.  Endo/Heme/Allergies: Does not bruise/bleed easily.     EKGs/Labs/Other Studies Reviewed:    The following studies were reviewed today:  No prior cardiovascular studies available.   EKG:  EKG is personally reviewed.   06/29/2021:  NSR at 100 bpm, nonspecific ST pattern  Recent Labs: No results found for requested labs within last 365 days.   Recent Lipid Panel No results found for: "CHOL", "TRIG", "HDL", "CHOLHDL", "VLDL", "LDLCALC", "LDLDIRECT"  Physical Exam:    VS:  BP (!) 154/90 (BP Location: Right Arm, Patient Position: Sitting, Cuff Size: Normal)   Pulse 100   Ht 5' 2.5" (1.588 m)   Wt 113 lb (51.3 kg)   LMP 05/11/2011   BMI 20.34 kg/m     Wt Readings from Last 3 Encounters:  06/29/21 113 lb (51.3 kg)  07/31/15 110 lb (49.9 kg)  05/23/15 109 lb 12.8 oz (49.8 kg)    GEN: Well nourished, well developed in no acute distress HEENT: Normal, moist mucous membranes NECK: No JVD CARDIAC: regular rhythm, normal S1 and S2, no rubs or gallops. No murmur. VASCULAR: Radial and DP pulses 2+ bilaterally. No carotid bruits RESPIRATORY:  Clear to auscultation without rales, wheezing or rhonchi  ABDOMEN: Soft, non-tender, non-distended MUSCULOSKELETAL:  Ambulates  independently SKIN: Warm and dry, no edema NEUROLOGIC:  Alert and oriented x 3. No focal neuro deficits noted. PSYCHIATRIC:  Normal affect    ASSESSMENT:    1. Bradycardia, unspecified   2. Family history of heart disease   3. Cardiac risk counseling   4. Counseling on health promotion and disease prevention   5. Nutritional counseling   6. Exercise counseling   7. White coat syndrome without diagnosis of hypertension   8. Pure hypercholesterolemia    PLAN:    Bradycardia: -incidental, asymptomatic, on her apple watch dating back ~5 years -reviewed high risk features that need immediate medical attention  Family history of heart disease: -reviewed both lifestyle and medications for prevention  Hypercholesterolemia: -I cannot see remote labs, but most recent lipids on rosuvastatin show Tchol 157, TG 172, HDL 61, LDL 67  White coat hypertension -normal vitals at home  Cardiac risk counseling and prevention recommendations: -recommend heart healthy/Mediterranean diet, with whole grains, fruits, vegetable, fish, lean meats, nuts, and olive oil. Limit salt. -recommend moderate walking, 3-5 times/week for 30-50 minutes  each session. Aim for at least 150 minutes.week. Goal should be pace of 3 miles/hours, or walking 1.5 miles in 30 minutes -recommend avoidance of tobacco products. Avoid excess alcohol.  Plan for follow up: As needed.  Karina Red, MD, PhD, [facility] [place]  [facility]    Medication Adjustments/Labs and Tests Ordered: Current medicines are reviewed at length with the patient today.  Concerns regarding medicines are outlined above.   Orders Placed This Encounter  Procedures   EKG 12-Lead   No orders of the defined types were placed in this encounter.  Patient Instructions  Medication Instructions:  Your Physician recommend you continue on your current medication as directed.    *If you need a refill on your cardiac medications before your  next appointment, please call your pharmacy*   Lab Work: None ordered today   Testing/Procedures: None ordered today   Follow-Up: At [facility], you and your health needs are our priority.  As part of our continuing mission to provide you with exceptional heart care, we have created designated Provider Care Teams.  These Care Teams include your primary Cardiologist (physician) and Advanced Practice Providers (APPs -  Physician Assistants and Nurse Practitioners) who all work together to provide you with the care you need, when you need it.  We recommend signing up for the patient portal called "MyChart".  Sign up information is provided on this After Visit Summary.  MyChart is used to connect with patients for Virtual Visits (Telemedicine).  Patients are able to view lab/test results, encounter notes, upcoming appointments, etc.  Non-urgent messages can be sent to your provider as well.   To learn more about what you can do with MyChart, go to [email or web address].    Your next appointment:   As needed   The format for your next appointment:   In Person  Provider:   Jodelle Red, MD             [facility] [place],acting as a scribe for Karina Red, MD.,have documented all relevant documentation on the behalf of Karina Red, MD,as directed by  Karina Red, MD while in the presence of Karina Red, MD.  I, Karina Red, MD, have reviewed all documentation for this visit. The documentation on 06/29/21 for the exam, diagnosis, procedures, and orders are all accurate and complete.   Signed, Karina Red, MD PhD 06/29/2021 9:05 AM    [facility]

## 2021-06-29 ENCOUNTER — Encounter (HOSPITAL_BASED_OUTPATIENT_CLINIC_OR_DEPARTMENT_OTHER): Payer: Self-pay | Admitting: Cardiology

## 2021-06-29 ENCOUNTER — Ambulatory Visit (INDEPENDENT_AMBULATORY_CARE_PROVIDER_SITE_OTHER): Payer: BC Managed Care – PPO | Admitting: Cardiology

## 2021-06-29 VITALS — BP 154/90 | HR 100 | Ht 62.5 in | Wt 113.0 lb

## 2021-06-29 DIAGNOSIS — R001 Bradycardia, unspecified: Secondary | ICD-10-CM | POA: Diagnosis not present

## 2021-06-29 DIAGNOSIS — Z7182 Exercise counseling: Secondary | ICD-10-CM

## 2021-06-29 DIAGNOSIS — Z8249 Family history of ischemic heart disease and other diseases of the circulatory system: Secondary | ICD-10-CM | POA: Diagnosis not present

## 2021-06-29 DIAGNOSIS — Z7189 Other specified counseling: Secondary | ICD-10-CM

## 2021-06-29 DIAGNOSIS — R030 Elevated blood-pressure reading, without diagnosis of hypertension: Secondary | ICD-10-CM

## 2021-06-29 DIAGNOSIS — E7800 Pure hypercholesterolemia, unspecified: Secondary | ICD-10-CM

## 2021-06-29 DIAGNOSIS — Z713 Dietary counseling and surveillance: Secondary | ICD-10-CM

## 2021-06-29 NOTE — Patient Instructions (Signed)

## 2021-08-17 ENCOUNTER — Other Ambulatory Visit: Payer: Self-pay | Admitting: Obstetrics and Gynecology

## 2021-08-17 DIAGNOSIS — Z1231 Encounter for screening mammogram for malignant neoplasm of breast: Secondary | ICD-10-CM

## 2021-10-13 ENCOUNTER — Ambulatory Visit: Payer: BC Managed Care – PPO

## 2021-10-18 ENCOUNTER — Ambulatory Visit
Admission: RE | Admit: 2021-10-18 | Discharge: 2021-10-18 | Disposition: A | Discharge status: Home / Self Care | Payer: BC Managed Care – PPO | Source: Ambulatory Visit | Attending: Obstetrics and Gynecology | Admitting: Obstetrics and Gynecology

## 2021-10-18 DIAGNOSIS — Z1231 Encounter for screening mammogram for malignant neoplasm of breast: Secondary | ICD-10-CM

## 2022-09-17 ENCOUNTER — Other Ambulatory Visit: Payer: Self-pay | Admitting: Obstetrics and Gynecology

## 2022-09-17 DIAGNOSIS — Z1231 Encounter for screening mammogram for malignant neoplasm of breast: Secondary | ICD-10-CM

## 2022-11-05 ENCOUNTER — Ambulatory Visit
Admission: RE | Admit: 2022-11-05 | Discharge: 2022-11-05 | Disposition: A | Discharge status: Home / Self Care | Payer: BC Managed Care – PPO | Source: Ambulatory Visit | Attending: Obstetrics and Gynecology | Admitting: Obstetrics and Gynecology

## 2022-11-05 DIAGNOSIS — Z1231 Encounter for screening mammogram for malignant neoplasm of breast: Secondary | ICD-10-CM

## 2023-09-04 ENCOUNTER — Other Ambulatory Visit: Payer: Self-pay | Admitting: Obstetrics and Gynecology

## 2023-09-04 DIAGNOSIS — Z1231 Encounter for screening mammogram for malignant neoplasm of breast: Secondary | ICD-10-CM

## 2023-11-06 ENCOUNTER — Ambulatory Visit
Admission: RE | Admit: 2023-11-06 | Discharge: 2023-11-06 | Disposition: A | Discharge status: Home / Self Care | Source: Ambulatory Visit | Attending: Obstetrics and Gynecology | Admitting: Obstetrics and Gynecology

## 2023-11-06 DIAGNOSIS — Z1231 Encounter for screening mammogram for malignant neoplasm of breast: Secondary | ICD-10-CM
